# Patient Record
Sex: Male | Born: 1960 | Race: White | Hispanic: No | Marital: Married
Health system: Southern US, Community
[De-identification: ages and names within clinical notes are randomized; demographics above are authoritative.]

---

## 2006-12-29 ENCOUNTER — Emergency Department: Payer: Self-pay | Admitting: Emergency Medicine

## 2018-11-15 ENCOUNTER — Emergency Department
Admission: EM | Admit: 2018-11-15 | Discharge: 2018-11-15 | Disposition: A | Discharge status: Home / Self Care | Payer: 59 | Attending: Emergency Medicine | Admitting: Emergency Medicine

## 2018-11-15 ENCOUNTER — Emergency Department: Payer: 59

## 2018-11-15 ENCOUNTER — Other Ambulatory Visit: Payer: Self-pay

## 2018-11-15 DIAGNOSIS — R109 Unspecified abdominal pain: Secondary | ICD-10-CM

## 2018-11-15 DIAGNOSIS — N23 Unspecified renal colic: Secondary | ICD-10-CM | POA: Insufficient documentation

## 2018-11-15 DIAGNOSIS — R1032 Left lower quadrant pain: Secondary | ICD-10-CM | POA: Diagnosis present

## 2018-11-15 DIAGNOSIS — R3129 Other microscopic hematuria: Secondary | ICD-10-CM | POA: Diagnosis not present

## 2018-11-15 LAB — URINALYSIS, COMPLETE (UACMP) WITH MICROSCOPIC
Bacteria, UA: NONE SEEN
Bilirubin Urine: NEGATIVE
Glucose, UA: NEGATIVE mg/dL
Ketones, ur: NEGATIVE mg/dL
Leukocytes, UA: NEGATIVE
Nitrite: NEGATIVE
PH: 5 (ref 5.0–8.0)
Protein, ur: NEGATIVE mg/dL
Specific Gravity, Urine: 1.023 (ref 1.005–1.030)
Squamous Epithelial / HPF: NONE SEEN (ref 0–5)

## 2018-11-15 LAB — BASIC METABOLIC PANEL
Anion gap: 5 (ref 5–15)
BUN: 16 mg/dL (ref 6–20)
CO2: 25 mmol/L (ref 22–32)
Calcium: 10 mg/dL (ref 8.9–10.3)
Chloride: 107 mmol/L (ref 98–111)
Creatinine, Ser: 1.09 mg/dL (ref 0.61–1.24)
GFR calc Af Amer: >60 mL/min (ref >60)
GFR calc non Af Amer: >60 mL/min (ref >60)
Glucose, Bld: 138 mg/dL — ABNORMAL HIGH (ref 70–99)
Potassium: 4.2 mmol/L (ref 3.5–5.1)
Sodium: 137 mmol/L (ref 135–145)

## 2018-11-15 LAB — LIPASE, BLOOD: Lipase: 33 U/L (ref 11–51)

## 2018-11-15 LAB — CBC
HCT: 41.5 % (ref 39.0–52.0)
Hemoglobin: 13.9 g/dL (ref 13.0–17.0)
MCH: 29.6 pg (ref 26.0–34.0)
MCHC: 33.5 g/dL (ref 30.0–36.0)
MCV: 88.3 fL (ref 80.0–100.0)
Platelets: 127 10*3/uL — ABNORMAL LOW (ref 150–400)
RBC: 4.7 MIL/uL (ref 4.22–5.81)
RDW: 13.6 % (ref 11.5–15.5)
WBC: 8.9 10*3/uL (ref 4.0–10.5)
nRBC: 0 % (ref 0.0–0.2)

## 2018-11-15 LAB — HEPATIC FUNCTION PANEL
ALT: 23 U/L (ref 0–44)
AST: 23 U/L (ref 15–41)
Albumin: 4 g/dL (ref 3.5–5.0)
Alkaline Phosphatase: 59 U/L (ref 38–126)
Bilirubin, Direct: <0.1 mg/dL (ref 0.0–0.2)
Total Bilirubin: 0.6 mg/dL (ref 0.3–1.2)
Total Protein: 6.7 g/dL (ref 6.5–8.1)

## 2018-11-15 MED ORDER — SODIUM CHLORIDE 0.9 % IV BOLUS
1000.0000 mL | Freq: Once | INTRAVENOUS | Status: AC
  Administered 2018-11-15: 1000 mL via INTRAVENOUS

## 2018-11-15 MED ORDER — KETOROLAC TROMETHAMINE 30 MG/ML IJ SOLN
INTRAMUSCULAR | Status: AC
  Administered 2018-11-15: 15 mg via INTRAVENOUS
  Filled 2018-11-15: qty 1

## 2018-11-15 MED ORDER — ONDANSETRON 4 MG PO TBDP: 4.0000 mg | ORAL_TABLET | Freq: Three times a day (TID) | ORAL | 0 refills | Status: AC | PRN

## 2018-11-15 MED ORDER — TAMSULOSIN HCL 0.4 MG PO CAPS: 0.4000 mg | ORAL_CAPSULE | Freq: Every day | ORAL | 0 refills | Status: AC

## 2018-11-15 MED ORDER — KETOROLAC TROMETHAMINE 10 MG PO TABS: 10.0000 mg | ORAL_TABLET | Freq: Four times a day (QID) | ORAL | 0 refills | Status: AC | PRN

## 2018-11-15 MED ORDER — TAMSULOSIN HCL 0.4 MG PO CAPS
0.4000 mg | ORAL_CAPSULE | ORAL | Status: AC
  Administered 2018-11-15: 0.4 mg via ORAL
  Filled 2018-11-15: qty 1

## 2018-11-15 MED ORDER — KETOROLAC TROMETHAMINE 30 MG/ML IJ SOLN
15.0000 mg | INTRAMUSCULAR | Status: AC
  Administered 2018-11-15: 15 mg via INTRAVENOUS

## 2018-11-15 NOTE — ED Provider Notes (Signed)
[facility] Emergency Department Provider Note  ____________________________________________  Time seen: Approximately 11:29 AM  I have reviewed the triage vital signs and the nursing notes.   HISTORY  Chief Complaint Flank Pain    HPI Daniel Rice is a 57 y.o. male with no significant past medical history who complains of left flank pain, nonradiating, sudden onset at about 4:30 AM today.  Constant, waxing and waning, no aggravating or alleviating factors.  Denies dysuria frequency urgency or hematuria.  No fevers chills vomiting chest pain or shortness of breath.      Past medical history noncontributory   There are no active problems to display for this patient.    Past surgical history noncontributory   Prior to Admission medications   Medication Sig Start Date End Date Taking? Authorizing Provider  ketorolac (TORADOL) 10 MG tablet Take 1 tablet (10 mg total) by mouth every 6 (six) hours as needed for moderate pain. 11/15/18   Sharman Cheek, MD  ondansetron (ZOFRAN ODT) 4 MG disintegrating tablet Take 1 tablet (4 mg total) by mouth every 8 (eight) hours as needed for nausea or vomiting. 11/15/18   Sharman Cheek, MD  tamsulosin (FLOMAX) 0.4 MG CAPS capsule Take 1 capsule (0.4 mg total) by mouth daily. 11/15/18   Sharman Cheek, MD  No prior medications   Allergies Penicillins and Soy allergy   No family history on file.  Social History Social History   Tobacco Use  . Smoking status: Not on file  Substance Use Topics  . Alcohol use: Not on file  . Drug use: Not on file  No tobacco or alcohol use.  Review of Systems  Constitutional:   No fever or chills.  ENT:   No sore throat. No rhinorrhea. Cardiovascular:   No chest pain or syncope. Respiratory:   No dyspnea or cough. Gastrointestinal:   Positive as above left leg pain without vomiting and diarrhea.  Musculoskeletal:   Negative for focal pain or swelling All other  systems reviewed and are negative except as documented above in ROS and HPI.  ____________________________________________   PHYSICAL EXAM:  VITAL SIGNS: ED Triage Vitals  Enc Vitals Group     BP 11/15/18 0603 (!) 142/90     Pulse Rate 11/15/18 0603 70     Resp 11/15/18 0603 18     Temp 11/15/18 0603 97.8 F (36.6 C)     Temp Source 11/15/18 0603 Oral     SpO2 11/15/18 0603 100 %     Weight 11/15/18 0533 240 lb (108.9 kg)     Height 11/15/18 0533 6' 1.5" (1.867 m)     Head Circumference --      Peak Flow --      Pain Score 11/15/18 0533 7     Pain Loc --      Pain Edu? --      Excl. in GC? --     Vital signs reviewed, nursing assessments reviewed.   Constitutional:   Alert and oriented. Non-toxic appearance. Eyes:   Conjunctivae are normal. EOMI. PERRL. ENT      Head:   Normocephalic and atraumatic.      Nose:   No congestion/rhinnorhea.       Mouth/Throat:   MMM, no pharyngeal erythema. No peritonsillar mass.       Neck:   No meningismus. Full ROM. Hematological/Lymphatic/Immunilogical:   No cervical lymphadenopathy. Cardiovascular:   RRR. Symmetric bilateral radial and DP pulses.  No murmurs. Cap refill less  than 2 seconds. Respiratory:   Normal respiratory effort without tachypnea/retractions. Breath sounds are clear and equal bilaterally. No wheezes/rales/rhonchi. Gastrointestinal:   Soft and nontender. Non distended. There is no CVA tenderness.  No rebound, rigidity, or guarding.  Positive left lower quadrant tenderness, mild  Musculoskeletal:   Normal range of motion in all extremities. No joint effusions.  No lower extremity tenderness.  No edema. Neurologic:   Normal speech and language.  Motor grossly intact. No acute focal neurologic deficits are appreciated.  Skin:    Skin is warm, dry and intact. No rash noted.  No petechiae, purpura, or bullae.  ____________________________________________    LABS (pertinent positives/negatives) (all labs ordered are  listed, but only abnormal results are displayed) Labs Reviewed  URINALYSIS, COMPLETE (UACMP) WITH MICROSCOPIC - Abnormal; Notable for the following components:      Result Value   Color, Urine YELLOW (*)    APPearance CLEAR (*)    Hgb urine dipstick MODERATE (*)    All other components within normal limits  BASIC METABOLIC PANEL - Abnormal; Notable for the following components:   Glucose, Bld 138 (*)    All other components within normal limits  CBC - Abnormal; Notable for the following components:   Platelets 127 (*)    All other components within normal limits  HEPATIC FUNCTION PANEL  LIPASE, BLOOD   ____________________________________________   EKG    ____________________________________________    RADIOLOGY  Ct Renal Stone Study  Result Date: 11/15/2018 CLINICAL DATA:  57 year old male with left flank pain since this morning. Hematuria. Initial encounter. EXAM: CT ABDOMEN AND PELVIS WITHOUT CONTRAST TECHNIQUE: Multidetector CT imaging of the abdomen and pelvis was performed following the standard protocol without IV contrast. COMPARISON:  None. FINDINGS: Lower chest: Minimal basilar atelectasis/scarring. Heart size within normal limits. Left coronary artery calcifications. Hepatobiliary: Top-normal size fatty liver. Minimally lobulated contour without other findings of cirrhosis. Coarse calcification inferior aspect right lobe liver. Taking into account limitation by non contrast imaging, no worrisome hepatic lesion. No calcified gallstone or common bile duct stone. No CT evidence of gallbladder inflammation. Pancreas: Taking into account limitation by non contrast imaging, no worrisome pancreatic lesion or inflammation. Spleen: Taking into account limitation by non contrast imaging, no splenic mass or enlargement. Adrenals/Urinary Tract: 4 mm left ureteral vesicle junction obstructing stone with moderate to marked left-sided hydroureteronephrosis with extravasation of urine  along the left kidney and ureter. Taking into account limitation by non contrast imaging, no worrisome renal or adrenal lesion. Circumferential thickening of the urinary bladder may be related to under distension although can not exclude a component of cystitis. Stomach/Bowel: Scattered colonic diverticula. No extraluminal bowel inflammatory process. Portions of the stomach, small bowel and colon under distended limiting evaluation for detection of a mass. Vascular/Lymphatic: No abdominal aortic aneurysm. Femoral artery calcified plaque. Scattered normal size lymph nodes including mesenteric lymph nodes. Reproductive: Top-normal size prostate gland. Other: No free air or bowel containing hernia. Fat containing inguinal hernias. Slight haziness of small bowel mesentery. Musculoskeletal: Degenerative changes lower thoracic and lumbar spine. IMPRESSION: 1. 4 mm left ureteral vesicle junction obstructing stone with moderate to marked left-sided hydroureteronephrosis with extravasation of urine along the left kidney and ureter. 2. Circumferential thickening of the urinary bladder may be related to under distension although can not exclude a component of cystitis. 3. Coronary artery calcification.  Femoral artery calcified plaque. 4. Fatty liver. 5. 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 Electronically Signed   By: Lacy DuverneySteven  Olson M.D.   On: 11/15/2018 09:31    ____________________________________________   PROCEDURES Procedures  ____________________________________________  DIFFERENTIAL DIAGNOSIS   Kidney stone, diverticulitis, pyelonephritis  CLINICAL IMPRESSION / ASSESSMENT AND PLAN / ED COURSE  Pertinent labs & imaging results that were available during my care of the patient were reviewed by me and considered in my medical decision making (see chart for details).      Clinical Course as of Nov 15 1128   Mon Nov 15, 2018  [id number] Patient complains of nonradiating left flank pain, sudden onset at about 4:30 AM today.  Pain resolved upon recently going to the bathroom to urinate.  Currently pain-free.  Most likely kidney stone.  I will check urinalysis and labs.  IV fluids for hydration.   [PS]  0940 CT reviewed, left UVJ stone, 4 mm with significant left-sided hydronephrosis.   [PS]  1117 CT findings discussed with urology Dr. Alvester MorinBell.  Recommend usual symptomatic management, Flomax, follow-up in a week.   [PS]    Clinical Course User Index [PS] Sharman CheekStafford, Kennadi Albany, MD     ____________________________________________   FINAL CLINICAL IMPRESSION(S) / ED DIAGNOSES    Final diagnoses:  Left flank pain  Other microscopic hematuria  Ureteral colic     ED Discharge Orders         Ordered    ketorolac (TORADOL) 10 MG tablet  Every 6 hours PRN     11/15/18 1128    tamsulosin (FLOMAX) 0.4 MG CAPS capsule  Daily     11/15/18 1128    ondansetron (ZOFRAN ODT) 4 MG disintegrating tablet  Every 8 hours PRN     11/15/18 1128          Portions of this note were generated with dragon dictation software. Dictation errors may occur despite best attempts at proofreading.   Sharman CheekStafford, Mamoru Takeshita, MD 11/15/18 1131

## 2018-11-15 NOTE — ED Notes (Signed)
Pt states pain becoming much worse now, rates pain 8/10.  Pt ambulatory to restroom with assistance from family.  See mar for meds given.

## 2018-11-15 NOTE — ED Triage Notes (Signed)
Reports left flank pain for the past hour. Denies urinary symptoms.

## 2018-11-15 NOTE — ED Notes (Signed)
Pt to ed with c/o acute onset of Left flank pain that started about 1 hour pta.  Pt denies difficulty urinating and denies pain with urination.  During assessment pt states that the pain has resolved recently and that he feels much better.  Pt appears in nad.  Pt skin warm and dry.  Pt alert and oriented with family at bedside.

## 2018-11-15 NOTE — Discharge Instructions (Signed)
Your CT scan shows a 4 mm kidney stone just outside the bladder on the left side, which is causing a lot of pressure on your left kidney.  This should pass into the bladder soon at which point your symptoms will dramatically improve.  Your blood tests and urine tests are otherwise normal.  We discussed these findings with the urologist who recommends taking Flomax once a day and following up in a week for reassessment.

## 2018-11-15 NOTE — ED Notes (Signed)
Dr. Scotty CourtStafford at bedside with pt.

## 2019-06-15 IMAGING — CT CT RENAL STONE PROTOCOL
2 of 4 series · 15 of 46 positions shown, 17 images · non-contrast
Comparison: None.

CLINICAL DATA: 57-year-old male with left flank pain since this
morning. Hematuria. Initial encounter.

EXAM:
CT ABDOMEN AND PELVIS WITHOUT CONTRAST
TECHNIQUE: Multidetector CT imaging of the abdomen and pelvis was performed
following the standard protocol without IV contrast.

[Series 2: stone full standard · axial · 0.80mm/px · z∈[-1213,-698]mm · 12 of 113 slices shown, 14 images]
[im 5/113  soft-tissue]
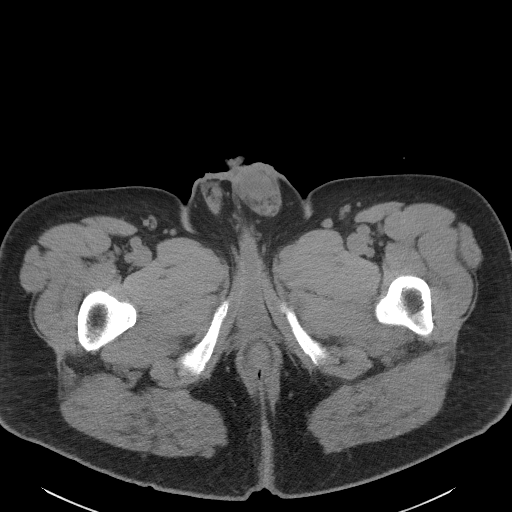
[im 5/113  bone]
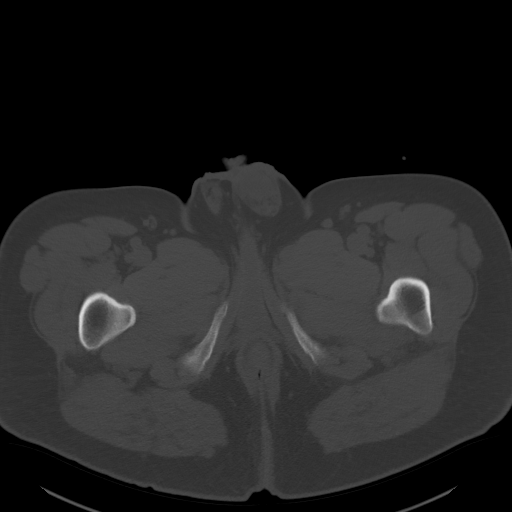
[im 15/113  soft-tissue]
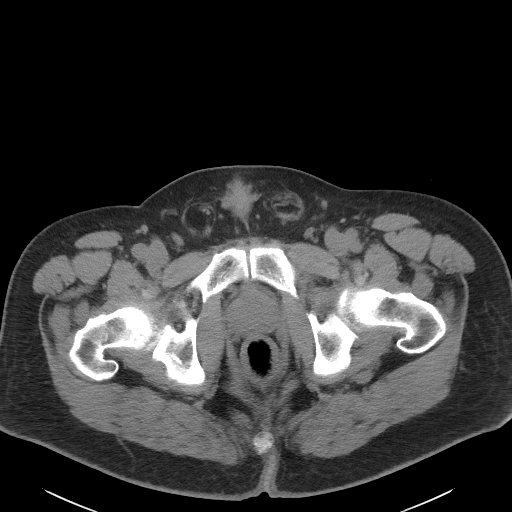
[im 24/113  soft-tissue]
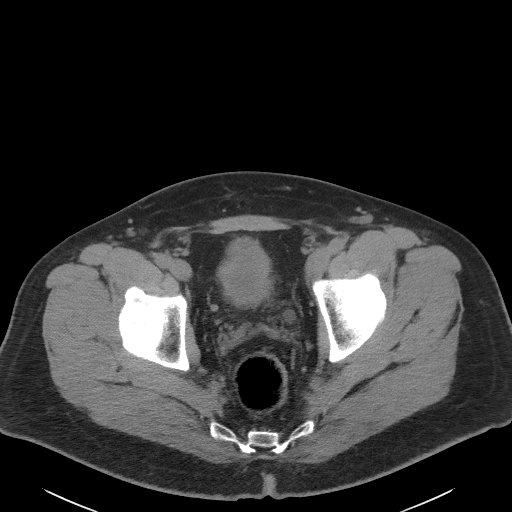
[im 33/113  soft-tissue]
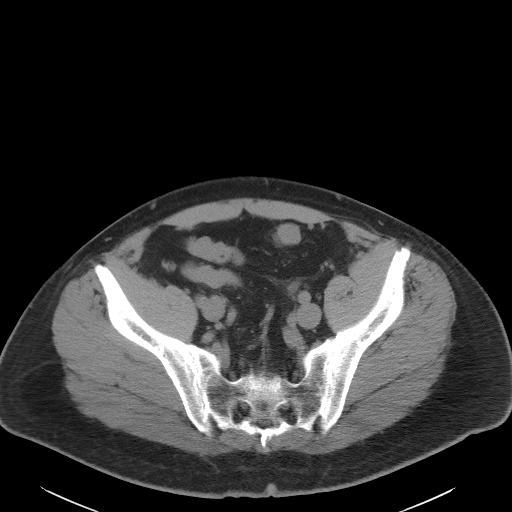
[im 43/113  soft-tissue]
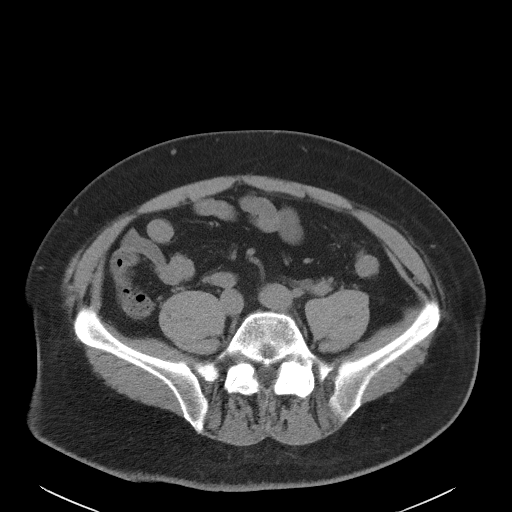
[im 52/113  soft-tissue]
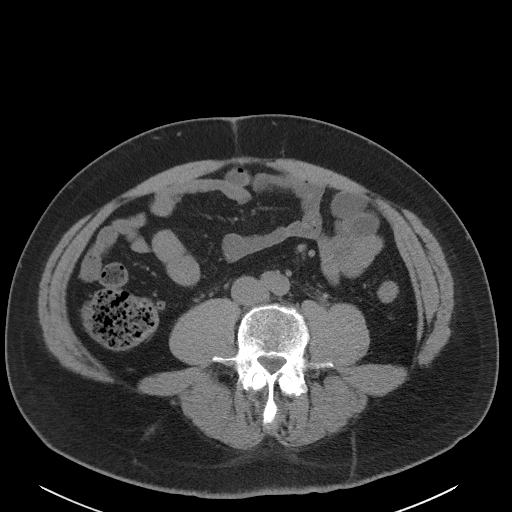
[im 61/113  soft-tissue]
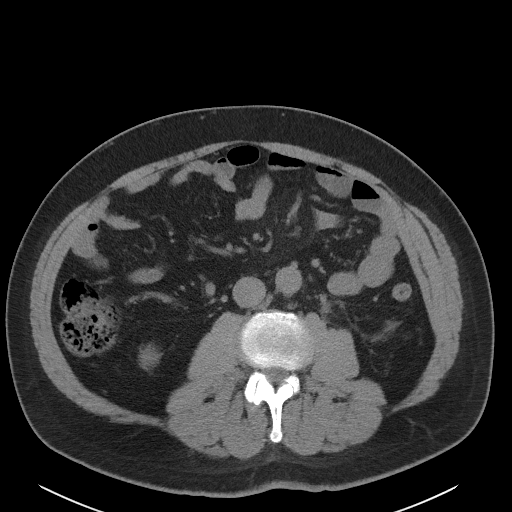
[im 71/113  soft-tissue]
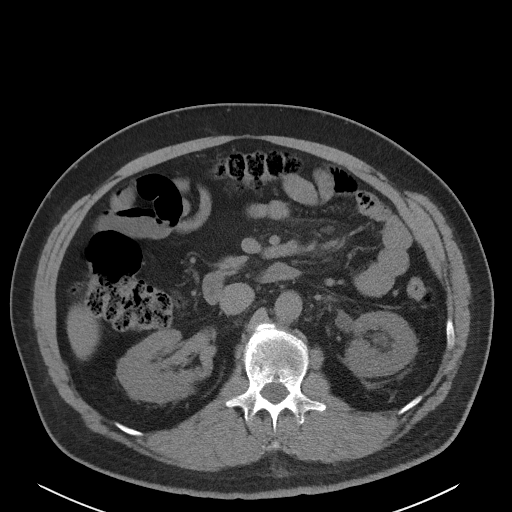
[im 80/113  soft-tissue]
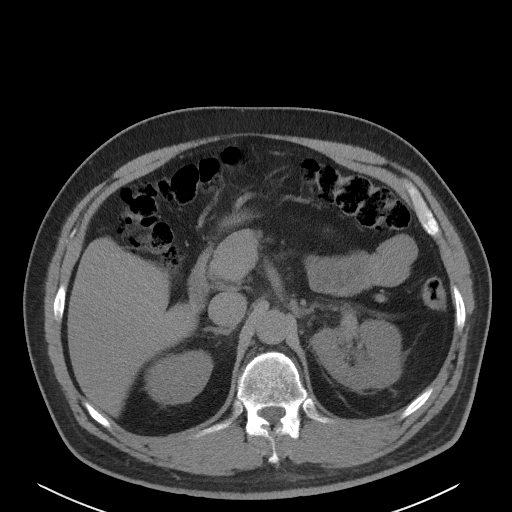
[im 80/113  bone]
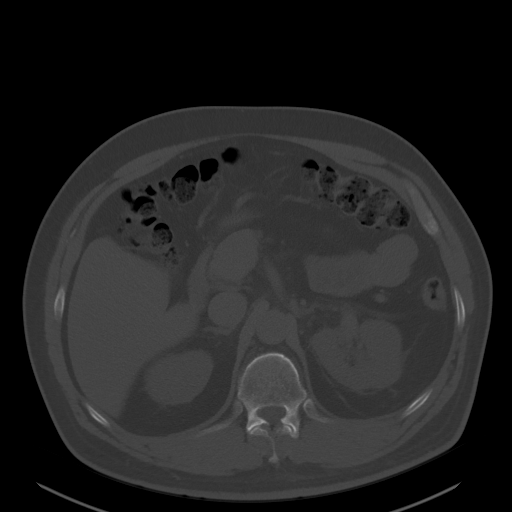
[im 89/113  soft-tissue]
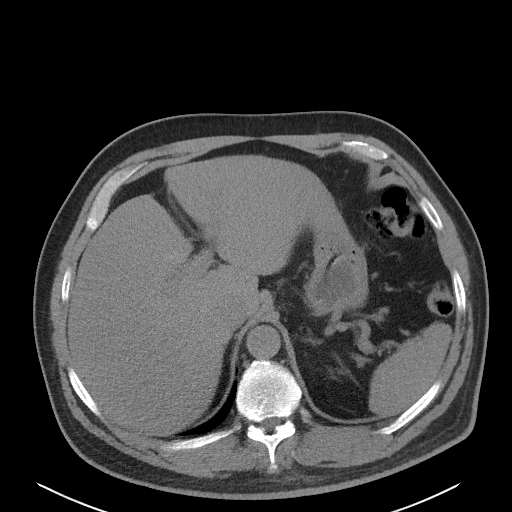
[im 99/113  soft-tissue]
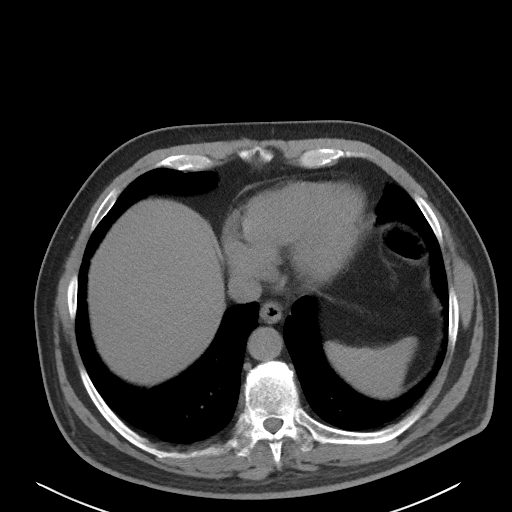
[im 108/113  soft-tissue]
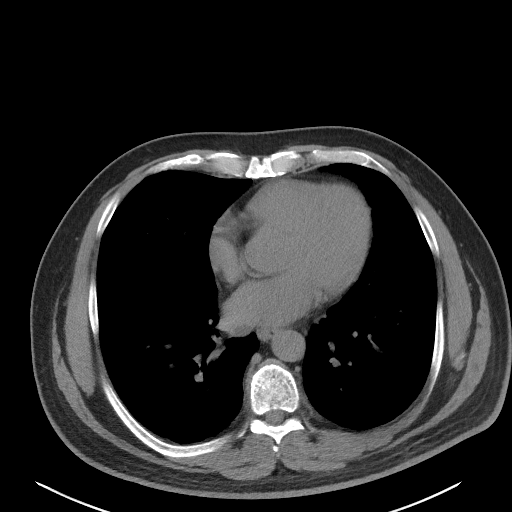

[Series 5: coronal · coronal · 0.82mm/px · 3 of 162 slices shown]
[im 54/162  soft-tissue]
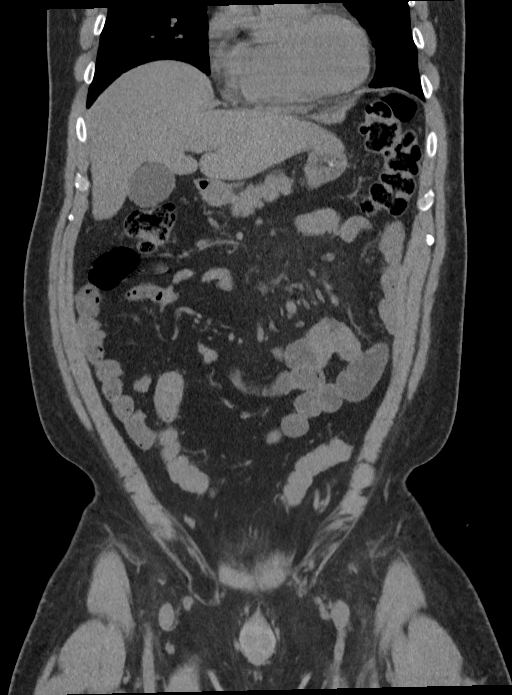
[im 72/162  soft-tissue]
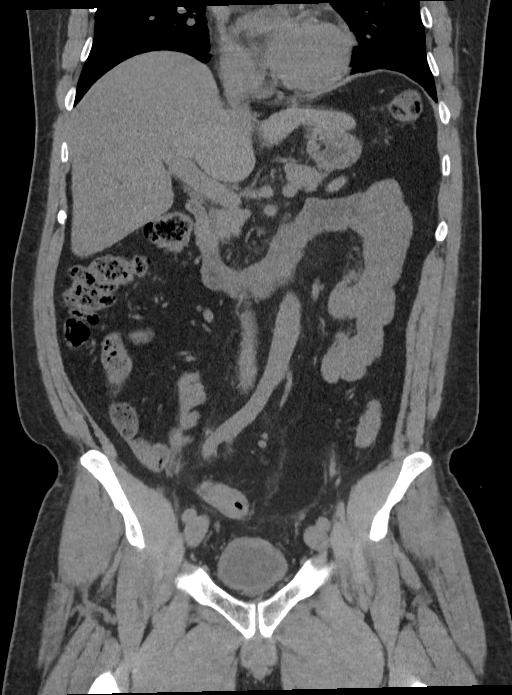
[im 90/162  soft-tissue]
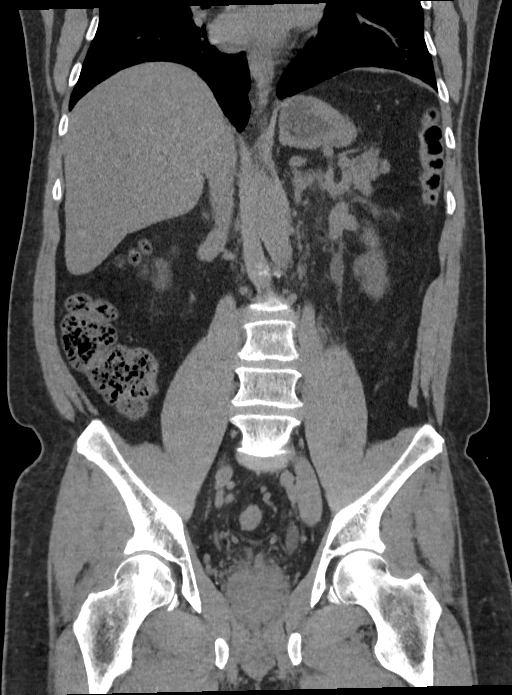

[15 of 46 positions shown; findings below may reference images not displayed]

FINDINGS: Lower chest: Minimal basilar atelectasis/scarring. Heart size within
normal limits. Left coronary artery calcifications.

Hepatobiliary: Top-normal size fatty liver. Minimally lobulated
contour without other findings of cirrhosis. Coarse calcification
inferior aspect right lobe liver. Taking into account limitation by
non contrast imaging, no worrisome hepatic lesion. No calcified
gallstone or common bile duct stone. No CT evidence of gallbladder
inflammation.

Pancreas: Taking into account limitation by non contrast imaging, no
worrisome pancreatic lesion or inflammation.

Spleen: Taking into account limitation by non contrast imaging, no
splenic mass or enlargement.

Adrenals/Urinary Tract: 4 mm left ureteral vesicle junction
obstructing stone with moderate to marked left-sided
hydroureteronephrosis with extravasation of urine along the left
kidney and ureter.

Taking into account limitation by non contrast imaging, no worrisome
renal or adrenal lesion. Circumferential thickening of the urinary
bladder may be related to under distension although can not exclude
a component of cystitis.

Stomach/Bowel: Scattered colonic diverticula. No extraluminal bowel
inflammatory process. Portions of the stomach, small bowel and colon
under distended limiting evaluation for detection of a mass.

Vascular/Lymphatic: No abdominal aortic aneurysm. Femoral artery
calcified plaque.

Scattered normal size lymph nodes including mesenteric lymph nodes.

Reproductive: Top-normal size prostate gland.

Other: No free air or bowel containing hernia. Fat containing
inguinal hernias. Slight haziness of small bowel mesentery.

Musculoskeletal: Degenerative changes lower thoracic and lumbar
spine.
IMPRESSION: 1. 4 mm left ureteral vesicle junction obstructing stone with
moderate to marked left-sided hydroureteronephrosis with
extravasation of urine along the left kidney and ureter.
2. Circumferential thickening of the urinary bladder may be related
to under distension although can not exclude a component of
cystitis.
3. Coronary artery calcification.  Femoral artery calcified plaque.
4. Fatty liver.
5. Slight haziness of small bowel mesentery with surrounding small
lymph nodes possibly representing changes of mild chronic
mesenteritis.

## 2025-01-17 ENCOUNTER — Ambulatory Visit: Payer: Self-pay | Admitting: Gerontology

## 2025-01-17 ENCOUNTER — Other Ambulatory Visit: Payer: Self-pay
